# Patient Record
Sex: Male | Born: 1975 | Race: Black or African American | Hispanic: No | Marital: Single | Smoking: Never smoker
Health system: Southern US, Community
[De-identification: ages and names within clinical notes are randomized; demographics above are authoritative.]

## PROBLEM LIST (undated history)

## (undated) DIAGNOSIS — R011 Cardiac murmur, unspecified: Secondary | ICD-10-CM

## (undated) HISTORY — PX: ANTERIOR CRUCIATE LIGAMENT REPAIR: SHX115

## (undated) HISTORY — DX: Cardiac murmur, unspecified: R01.1

## (undated) HISTORY — PX: ROTATOR CUFF REPAIR: SHX139

---

## 1999-04-27 ENCOUNTER — Ambulatory Visit (HOSPITAL_COMMUNITY): Admission: RE | Admit: 1999-04-27 | Discharge: 1999-04-27 | Payer: Self-pay | Admitting: Family Medicine

## 2001-10-12 ENCOUNTER — Ambulatory Visit (HOSPITAL_BASED_OUTPATIENT_CLINIC_OR_DEPARTMENT_OTHER): Admission: RE | Admit: 2001-10-12 | Discharge: 2001-10-13 | Payer: Self-pay | Admitting: Orthopedic Surgery

## 2008-12-08 ENCOUNTER — Emergency Department (HOSPITAL_COMMUNITY): Admission: EM | Admit: 2008-12-08 | Discharge: 2008-12-09 | Payer: Self-pay | Admitting: Emergency Medicine
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
## 2014-03-19 ENCOUNTER — Encounter (HOSPITAL_COMMUNITY): Payer: Self-pay | Admitting: Emergency Medicine

## 2014-03-19 ENCOUNTER — Emergency Department (HOSPITAL_COMMUNITY)
Admission: EM | Admit: 2014-03-19 | Discharge: 2014-03-19 | Disposition: A | Discharge status: Home / Self Care | Payer: 59 | Attending: Emergency Medicine | Admitting: Emergency Medicine

## 2014-03-19 ENCOUNTER — Emergency Department (HOSPITAL_COMMUNITY): Payer: 59

## 2014-03-19 DIAGNOSIS — Y92410 Unspecified street and highway as the place of occurrence of the external cause: Secondary | ICD-10-CM | POA: Insufficient documentation

## 2014-03-19 DIAGNOSIS — Z792 Long term (current) use of antibiotics: Secondary | ICD-10-CM | POA: Insufficient documentation

## 2014-03-19 DIAGNOSIS — Y9389 Activity, other specified: Secondary | ICD-10-CM | POA: Diagnosis not present

## 2014-03-19 DIAGNOSIS — S0993XA Unspecified injury of face, initial encounter: Secondary | ICD-10-CM | POA: Diagnosis present

## 2014-03-19 DIAGNOSIS — S139XXA Sprain of joints and ligaments of unspecified parts of neck, initial encounter: Secondary | ICD-10-CM | POA: Insufficient documentation

## 2014-03-19 DIAGNOSIS — R404 Transient alteration of awareness: Secondary | ICD-10-CM | POA: Insufficient documentation
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
## 2014-03-19 NOTE — ED Notes (Signed)
Patient transported to CT 

## 2014-03-21 NOTE — ED Provider Notes (Signed)
Medical screening examination/treatment/procedure(s) were performed by non-physician practitioner and as supervising physician I was immediately available for consultation/collaboration.    Linwood DibblesJon Ab Leaming, MD 03/21/14 [phone]

## 2014-04-26 ENCOUNTER — Encounter (HOSPITAL_COMMUNITY): Payer: Self-pay | Admitting: Emergency Medicine

## 2014-04-26 ENCOUNTER — Emergency Department (HOSPITAL_COMMUNITY)
Admission: EM | Admit: 2014-04-26 | Discharge: 2014-04-26 | Disposition: A | Discharge status: Home / Self Care | Payer: 59 | Attending: Emergency Medicine | Admitting: Emergency Medicine

## 2014-04-26 ENCOUNTER — Emergency Department (HOSPITAL_COMMUNITY): Payer: 59

## 2014-04-26 DIAGNOSIS — R0789 Other chest pain: Secondary | ICD-10-CM

## 2014-04-26 DIAGNOSIS — R079 Chest pain, unspecified: Secondary | ICD-10-CM | POA: Diagnosis present

## 2014-04-26 LAB — CBC WITH DIFFERENTIAL/PLATELET
Basophils Absolute: 0 10*3/uL (ref 0.0–0.1)
Basophils Relative: 0 % (ref 0–1)
EOS ABS: 0.1 10*3/uL (ref 0.0–0.7)
EOS PCT: 3 % (ref 0–5)
HEMATOCRIT: 39.8 % (ref 39.0–52.0)
HEMOGLOBIN: 13.7 g/dL (ref 13.0–17.0)
LYMPHS ABS: 1.2 10*3/uL (ref 0.7–4.0)
Lymphocytes Relative: 22 % (ref 12–46)
MCH: 29 pg (ref 26.0–34.0)
MCHC: 34.4 g/dL (ref 30.0–36.0)
MCV: 84.3 fL (ref 78.0–100.0)
MONO ABS: 0.4 10*3/uL (ref 0.1–1.0)
MONOS PCT: 8 % (ref 3–12)
NEUTROS PCT: 67 % (ref 43–77)
Neutro Abs: 3.5 10*3/uL (ref 1.7–7.7)
Platelets: 198 10*3/uL (ref 150–400)
RBC: 4.72 MIL/uL (ref 4.22–5.81)
RDW: 12.2 % (ref 11.5–15.5)
WBC: 5.3 10*3/uL (ref 4.0–10.5)

## 2014-04-26 LAB — I-STAT TROPONIN, ED
TROPONIN I, POC: 0 ng/mL (ref 0.00–0.08)
Troponin i, poc: 0 ng/mL (ref 0.00–0.08)

## 2014-04-26 LAB — COMPREHENSIVE METABOLIC PANEL
ALK PHOS: 54 U/L (ref 39–117)
ALT: 27 U/L (ref 0–53)
ANION GAP: 12 (ref 5–15)
AST: 19 U/L (ref 0–37)
Albumin: 4 g/dL (ref 3.5–5.2)
BUN: 10 mg/dL (ref 6–23)
CO2: 28 mEq/L (ref 19–32)
Calcium: 8.9 mg/dL (ref 8.4–10.5)
Chloride: 104 mEq/L (ref 96–112)
Creatinine, Ser: 0.84 mg/dL (ref 0.50–1.35)
GFR calc non Af Amer: >90 mL/min (ref >90)
GLUCOSE: 114 mg/dL — AB (ref 70–99)
POTASSIUM: 3.9 meq/L (ref 3.7–5.3)
Sodium: 144 mEq/L (ref 137–147)
TOTAL PROTEIN: 7.6 g/dL (ref 6.0–8.3)
Total Bilirubin: 0.9 mg/dL (ref 0.3–1.2)

## 2014-04-26 MED ORDER — ASPIRIN 81 MG PO CHEW
324.0000 mg | CHEWABLE_TABLET | Freq: Once | ORAL | Status: AC
  Administered 2014-04-26: 324 mg via ORAL
  Filled 2014-04-26: qty 4

## 2014-04-26 NOTE — Discharge Instructions (Signed)
You were seen and evaluated for your chest pain symptoms. Your laboratory testing, EKG of your heart chest x-ray did not show any signs for a concerning for emergent cause of your symptoms. At this time your providers feel that your symptoms are most likely caused from muscle skeletal pains. Please followup with a primary care provider for continued evaluation and treatment. Return any time for changing or worsening symptoms.   Chest Pain (Nonspecific) It is often hard to give a diagnosis for the cause of chest pain. There is always a chance that your pain could be related to something serious, such as a heart attack or a blood clot in the lungs. You need to follow up with your doctor. HOME CARE  If antibiotic medicine was given, take it as directed by your doctor. Finish the medicine even if you start to feel better.  For the next few days, avoid activities that bring on chest pain. Continue physical activities as told by your doctor.  Do not use any tobacco products. This includes cigarettes, chewing tobacco, and e-cigarettes.  Avoid drinking alcohol.  Only take medicine as told by your doctor.  Follow your doctor's suggestions for more testing if your chest pain does not go away.  Keep all doctor visits you made. GET HELP IF:  Your chest pain does not go away, even after treatment.  You have a rash with blisters on your chest.  You have a fever. GET HELP RIGHT AWAY IF:   You have more pain or pain that spreads to your arm, neck, jaw, back, or belly (abdomen).  You have shortness of breath.  You cough more than usual or cough up blood.  You have very bad back or belly pain.  You feel sick to your stomach (nauseous) or throw up (vomit).  You have very bad weakness.  You pass out (faint).  You have chills. This is an emergency. Do not wait to see if the problems will go away. Call your local emergency services (911 in [place] Do not drive yourself to the hospital. MAKE  SURE YOU:   Understand these instructions.  Will watch your condition.  Will get help right away if you are not doing well or get worse. Document Released: 02/16/2008 Document Revised: 09/04/2013 Document Reviewed: 02/16/2008 [facility] LPExitCare Patient Information 2015 [place], [place]. This information is not intended to replace advice given to you by your health care provider. Make sure you discuss any questions you have with your health care provider.

## 2014-04-26 NOTE — ED Provider Notes (Signed)
CSN: [id number]     Arrival date & time 04/26/14  0039 History   First MD Initiated Contact with Patient 04/26/14 0254     Chief Complaint  Patient presents with  . Chest Pain    HPI  History provided by the patient. Patient is a 38 year old male with no significant PMH presenting with sharp left chest pain. Patient states that he was watching TV late last night and early this morning and as he was getting up from his chair to go to bed he suddenly had a sharp pain into his left chest and near his armpit. The pain caused him to freeze in this position and made it very difficult to even breathe deeply. He had very severe pain for about 20 minutes. This did continued to last and slowly go away over the next one to 2 hours. Patient currently is pain-free. He denies having any shortness of breath. He has been feeling well recently. Denies any significant strenuous activity recently. Denies any other aggravating or alleviating factors. No other associated symptoms. He is a nonsmoker. No history of hypertension, diabetes or hypercholesterolemia. No significant family history for coronary artery disease. Patient has no prior history of DVTs or PE. Denies any hemoptysis. No recent travel. No history of cancer.     History reviewed. No pertinent past medical history. Past Surgical History  Procedure Laterality Date  . Anterior cruciate ligament repair    . Rotator cuff repair     No family history on file. History  Substance Use Topics  . Smoking status: Never Smoker   . Smokeless tobacco: Not on file  . Alcohol Use: No    Review of Systems  Constitutional: Negative for fever.  Respiratory: Negative for cough.   Cardiovascular: Positive for chest pain.  All other systems reviewed and are negative.     Allergies  Review of patient's allergies indicates no known allergies.  Home Medications   Prior to Admission medications   Not on File   BP 125/58  Pulse 60  Temp(Src) 98.1 F (36.7  C) (Oral)  Resp 16  Ht 5\' 11"  (1.803 m)  Wt 168 lb (76.204 kg)  BMI 23.44 kg/m2  SpO2 98% Physical Exam  Nursing note and vitals reviewed. Constitutional: He is oriented to person, place, and time. He appears well-developed and well-nourished. No distress.  HENT:  Head: Normocephalic.  Cardiovascular: Normal rate and regular rhythm.   No murmur heard. Pulmonary/Chest: Effort normal and breath sounds normal. No respiratory distress. He has no wheezes. He has no rales. He exhibits no tenderness.  Abdominal: Soft. There is no tenderness. There is no rebound and no guarding.  Neurological: He is alert and oriented to person, place, and time.  Skin: Skin is warm.    ED Course  Procedures   COORDINATION OF CARE:  Nursing notes reviewed. Vital signs reviewed. Initial pt interview and examination performed.   Filed Vitals:   04/26/14 0049  BP: 125/58  Pulse: 60  Temp: 98.1 F (36.7 C)  TempSrc: Oral  Resp: 16  Height: 5\' 11"  (1.803 m)  Weight: 168 lb (76.204 kg)  SpO2: 98%    3:18 AM- patient seen and evaluated. He is well-appearing no acute distress. Denies any pain symptoms this time. No significant risk factors for ACS. Atypical pains at rest. Patient is PERC negative.   Treatment plan initiated: Medications  aspirin chewable tablet 324 mg (not administered)    Results for orders placed during the hospital encounter of  04/26/14  CBC WITH DIFFERENTIAL      Result Value Ref Range   WBC 5.3  4.0 - 10.5 K/uL   RBC 4.72  4.22 - 5.81 MIL/uL   Hemoglobin 13.7  13.0 - 17.0 g/dL   HCT 16.1  09.6 - 04.5 %   MCV 84.3  78.0 - 100.0 fL   MCH 29.0  26.0 - 34.0 pg   MCHC 34.4  30.0 - 36.0 g/dL   RDW 40.9  81.1 - 91.4 %   Platelets 198  150 - 400 K/uL   Neutrophils Relative % 67  43 - 77 %   Neutro Abs 3.5  1.7 - 7.7 K/uL   Lymphocytes Relative 22  12 - 46 %   Lymphs Abs 1.2  0.7 - 4.0 K/uL   Monocytes Relative 8  3 - 12 %   Monocytes Absolute 0.4  0.1 - 1.0 K/uL    Eosinophils Relative 3  0 - 5 %   Eosinophils Absolute 0.1  0.0 - 0.7 K/uL   Basophils Relative 0  0 - 1 %   Basophils Absolute 0.0  0.0 - 0.1 K/uL  COMPREHENSIVE METABOLIC PANEL      Result Value Ref Range   Sodium 144  137 - 147 mEq/L   Potassium 3.9  3.7 - 5.3 mEq/L   Chloride 104  96 - 112 mEq/L   CO2 28  19 - 32 mEq/L   Glucose, Bld 114 (*) 70 - 99 mg/dL   BUN 10  6 - 23 mg/dL   Creatinine, Ser 7.82  0.50 - 1.35 mg/dL   Calcium 8.9  8.4 - 95.6 mg/dL   Total Protein 7.6  6.0 - 8.3 g/dL   Albumin 4.0  3.5 - 5.2 g/dL   AST 19  0 - 37 U/L   ALT 27  0 - 53 U/L   Alkaline Phosphatase 54  39 - 117 U/L   Total Bilirubin 0.9  0.3 - 1.2 mg/dL   GFR calc non Af Amer >90  >90 mL/min   GFR calc Af Amer >90  >90 mL/min   Anion gap 12  5 - 15  I-STAT TROPOININ, ED      Result Value Ref Range   Troponin i, poc 0.00  0.00 - 0.08 ng/mL   Comment 3           I-STAT TROPOININ, ED      Result Value Ref Range   Troponin i, poc 0.00  0.00 - 0.08 ng/mL   Comment 3                Imaging Review Dg Chest 2 View  04/26/2014   CLINICAL DATA:  Left-sided chest pain.  EXAM: CHEST  2 VIEW  COMPARISON:  None.  FINDINGS: The lungs are well-aerated and clear. There is no evidence of focal opacification, pleural effusion or pneumothorax.  The heart is normal in size; the mediastinal contour is within normal limits. No acute osseous abnormalities are seen.  IMPRESSION: No acute cardiopulmonary process seen.   Electronically Signed   By: Roanna Raider M.D.   On: 04/26/2014 02:10     EKG Interpretation None      Date: 04/26/2014  Rate: 67  Rhythm: normal sinus rhythm and sinus arrhythmia  QRS Axis: normal  Intervals: normal  ST/T Wave abnormalities: nonspecific ST/T changes  Conduction Disutrbances:none  Narrative Interpretation:   Old EKG Reviewed: none available   MDM   Final diagnoses:  Musculoskeletal  chest pain        Angus Seller, PA-C 04/26/14 0403

## 2014-04-26 NOTE — ED Notes (Signed)
Pt to ED via [facility] with c/o chest pain.  Pt st's he was getting up from couch when he felt something pull under left breast.  C/O pain with movement and deep breath.

## 2014-04-27 NOTE — ED Provider Notes (Signed)
Medical screening examination/treatment/procedure(s) were performed by non-physician practitioner and as supervising physician I was immediately available for consultation/collaboration.   EKG Interpretation None       Shanette Tamargo, MD 04/27/14 0033 
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
## 2015-01-11 IMAGING — CR DG CHEST 2V
2 series · 2 of 2 positions shown · non-contrast
Comparison: None.

CLINICAL DATA: Left-sided chest pain.

EXAM:
CHEST  2 VIEW

[w chest pa]
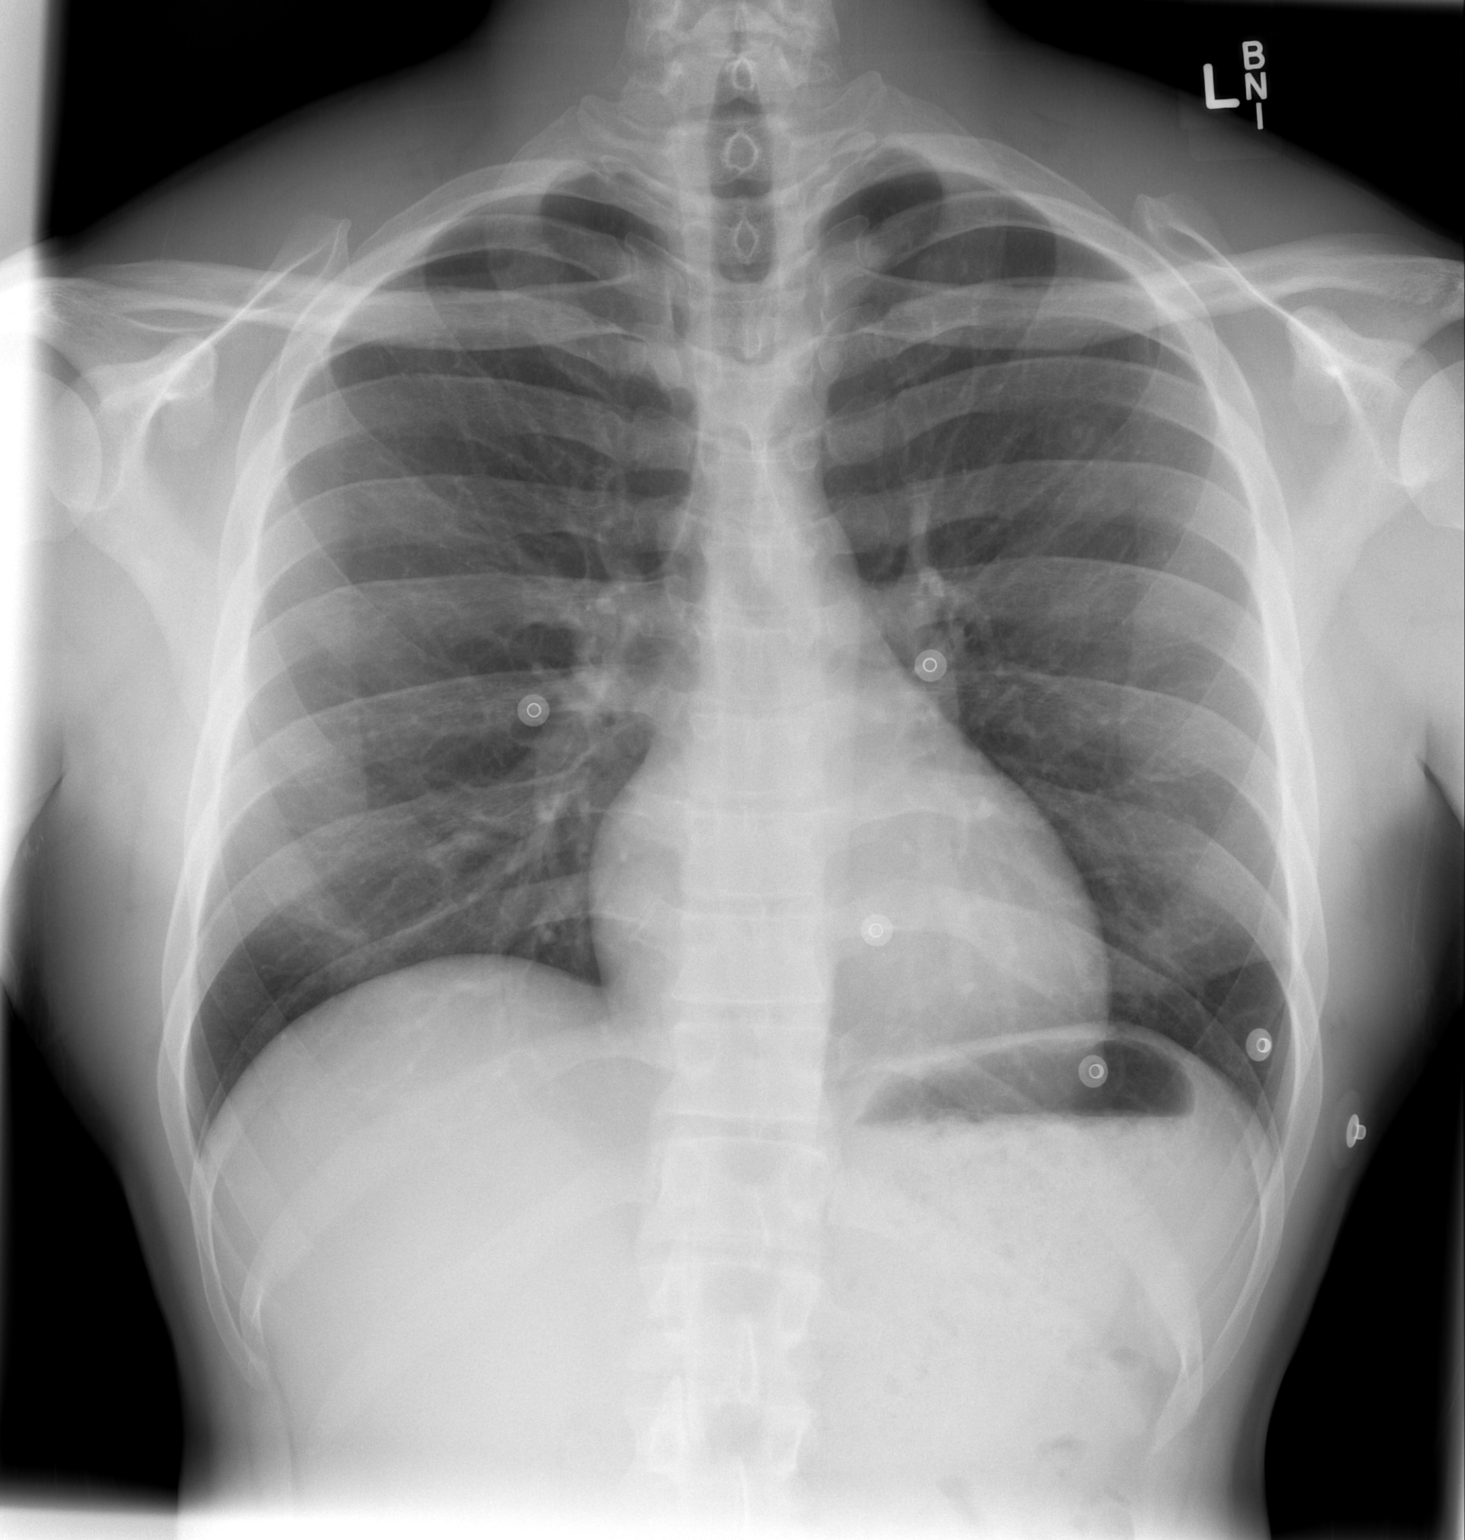

[w chest lat]
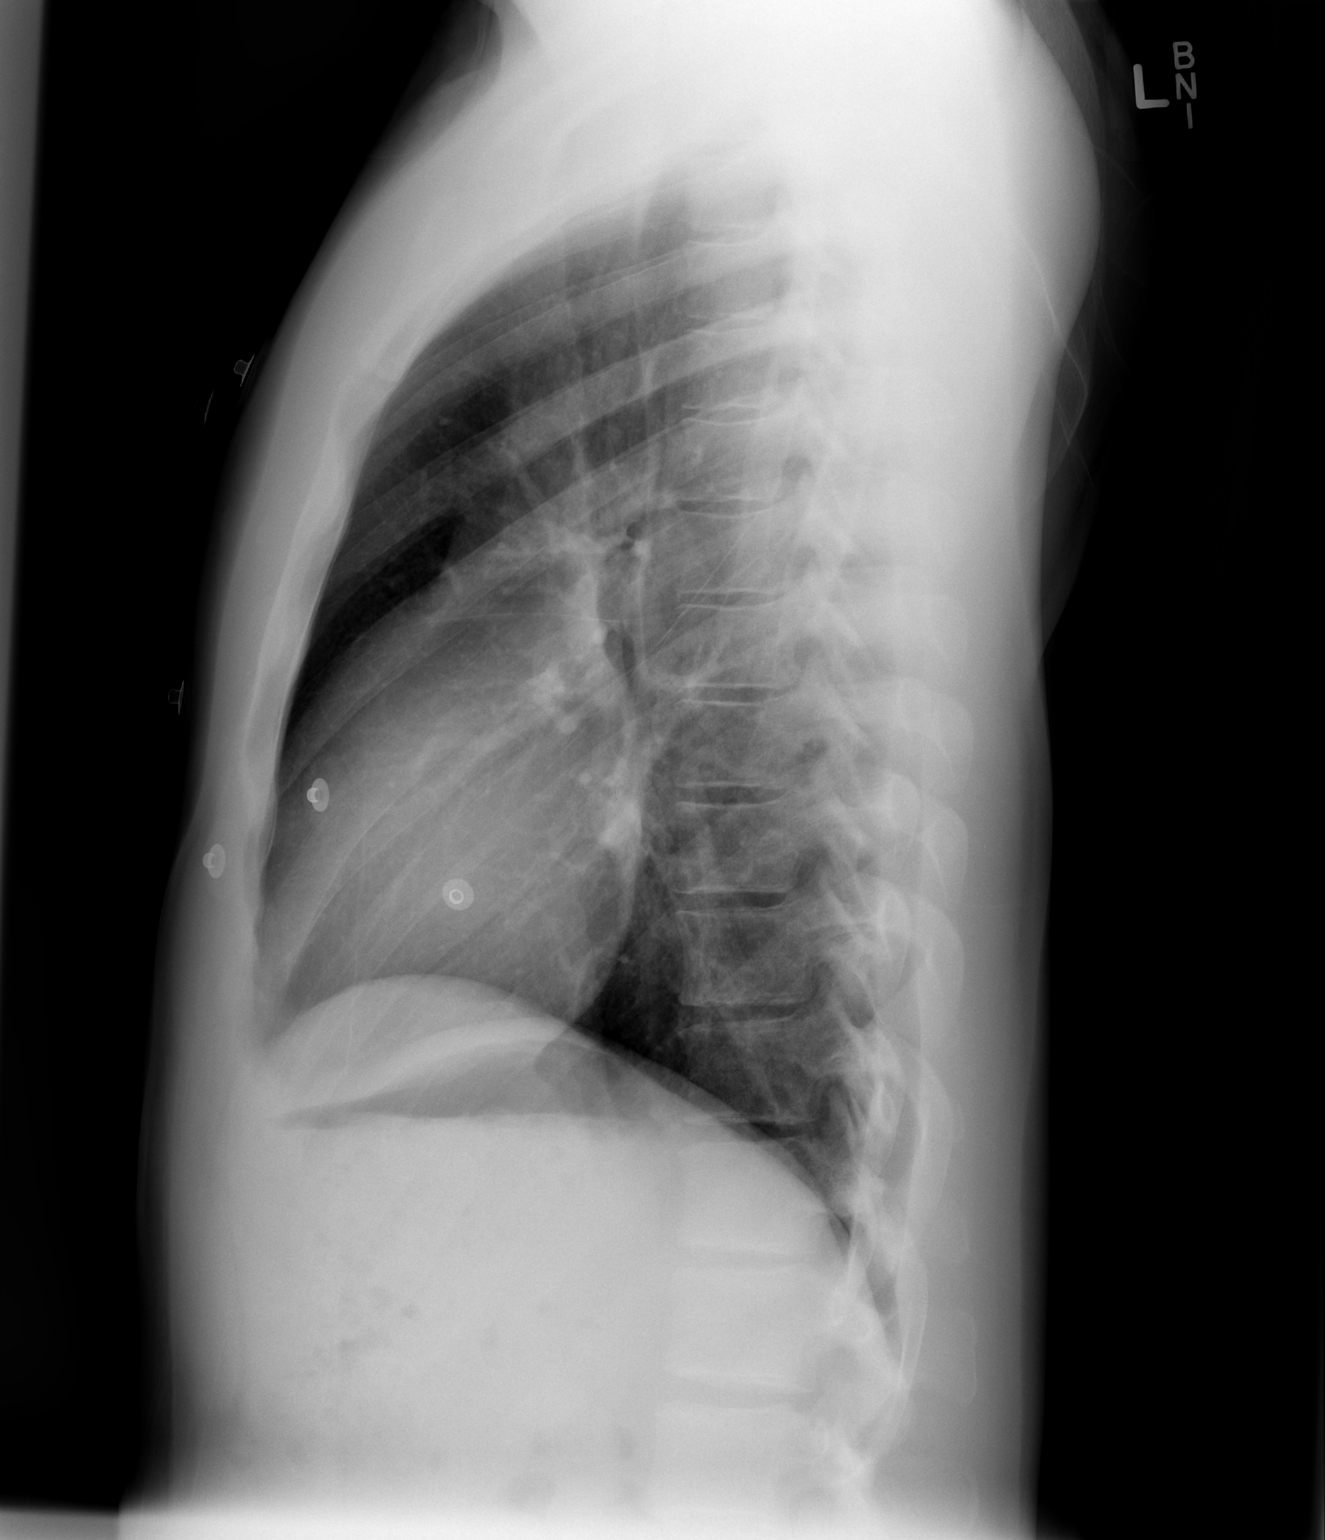

[2 of 2 positions shown; findings below may reference images not displayed]

FINDINGS: The lungs are well-aerated and clear. There is no evidence of focal
opacification, pleural effusion or pneumothorax.

The heart is normal in size; the mediastinal contour is within
normal limits. No acute osseous abnormalities are seen.
IMPRESSION: No acute cardiopulmonary process seen.

## 2015-12-30 ENCOUNTER — Ambulatory Visit (INDEPENDENT_AMBULATORY_CARE_PROVIDER_SITE_OTHER): Payer: 59

## 2015-12-30 ENCOUNTER — Ambulatory Visit (INDEPENDENT_AMBULATORY_CARE_PROVIDER_SITE_OTHER): Payer: 59 | Admitting: Family Medicine

## 2015-12-30 VITALS — BP 108/62 | HR 56 | Temp 98.2°F | Resp 16 | Ht 70.0 in | Wt 168.0 lb

## 2015-12-30 DIAGNOSIS — R1032 Left lower quadrant pain: Secondary | ICD-10-CM

## 2015-12-30 LAB — POCT CBC
Granulocyte percent: 53.8 %G (ref 37–80)
HCT, POC: 39.2 % — AB (ref 43.5–53.7)
Hemoglobin: 13.8 g/dL — AB (ref 14.1–18.1)
Lymph, poc: 1.5 (ref 0.6–3.4)
MCH, POC: 29.2 pg (ref 27–31.2)
MCHC: 35.2 g/dL (ref 31.8–35.4)
MCV: 83 fL (ref 80–97)
MID (cbc): 0.4 (ref 0–0.9)
MPV: 7.9 fL (ref 0–99.8)
POC Granulocyte: 2.2 (ref 2–6.9)
POC LYMPH PERCENT: 37.3 %L (ref 10–50)
POC MID %: 8.9 %M (ref 0–12)
Platelet Count, POC: 183 10*3/uL (ref 142–424)
RBC: 4.73 M/uL (ref 4.69–6.13)
RDW, POC: 12.7 %
WBC: 4.1 10*3/uL — AB (ref 4.6–10.2)

## 2015-12-30 MED ORDER — POLYETHYLENE GLYCOL 3350 17 GM/SCOOP PO POWD: 17.0000 g | Freq: Two times a day (BID) | ORAL | Status: AC | PRN

## 2015-12-30 NOTE — Progress Notes (Signed)
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 Comes in with left-sided abdominal pain which is intermittent the last 24 hours. It's never been really severe has not prevented him from sleeping.  He denies any constipation or urinary problems. He also denies any fever. He's never had this before except when he was about fifth-grade when he was told he had gas pulse in his left side and that it would pass up he stopped eating greasy foods.  Objective: Thin, athletic individual in no acute distress BP 108/62 mmHg  Pulse 56  Temp(Src) 98.2 F (36.8 C)  Resp 16  Ht 5\' 10"  (1.778 m)  Wt 168 lb (76.204 kg)  BMI 24.11 kg/m2  SpO2 98% Chest is clear Heart: Regular no murmur Abdomen: No significant tenderness although patient does feel some fullness in the left lower quadrant with deep palpation. Skin: No rash Extremities: No edema, full range of motion Results for orders placed or performed in visit on 12/30/15  POCT CBC  Result Value Ref Range   WBC 4.1 (A) 4.6 - 10.2 K/uL   Lymph, poc 1.5 0.6 - 3.4   POC LYMPH PERCENT 37.3 10 - 50 %L   MID (cbc) 0.4 0 - 0.9   POC MID % 8.9 0 - 12 %M   POC Granulocyte 2.2 2 - 6.9   Granulocyte percent 53.8 37 - 80 %G   RBC 4.73 4.69 - 6.13 M/uL   Hemoglobin 13.8 (A) 14.1 - 18.1 g/dL   HCT, POC 16.139.2 (A) 09.643.5 - 53.7 %   MCV 83.0 80 - 97 fL   MCH, POC 29.2 27 - 31.2 pg   MCHC 35.2 31.8 - 35.4 g/dL   RDW, POC 04.512.7 %   Platelet Count, POC 183 142 - 424 K/uL   MPV 7.9 0 - 99.8 fL   KUB of abdomen reveals normal stool pattern  Assessment:  I believe this patient is having some degree of obstipation.  Plan: MiraLAX for the next 24-48 hours, call if symptoms worsen.  Signed, Sheila OatsKurt Horace Lukas M.D.

## 2015-12-30 NOTE — Patient Instructions (Addendum)
It appears that there are some undigested food and left side near: And therefore I'm giving you a laxative to get this to move out. Usually this takes about 24-48 hours. If you're pain worsens or does not resolve, please call    IF you received an x-ray today, you will receive an invoice from [facility]. Please contact [facility] at [phone] with questions or concerns regarding your invoice.   IF you received labwork today, you will receive an invoice from [facility]. Please contact [facility] at [phone] with questions or concerns regarding your invoice.   Our billing staff will not be able to assist you with questions regarding bills from these companies.  You will be contacted with the lab results as soon as they are available. The fastest way to get your results is to activate your My Chart account. Instructions are located on the last page of this paperwork. If you have not heard from us regarding the results in 2 weeks, please contact this office.

## 2016-09-15 IMAGING — CR DG ABDOMEN 1V
1 series · 1 of 1 positions shown · non-contrast
Comparison: None in PACs

CLINICAL DATA: Left lower quadrant discomfort

EXAM:
ABDOMEN - 1 VIEW

[AP]
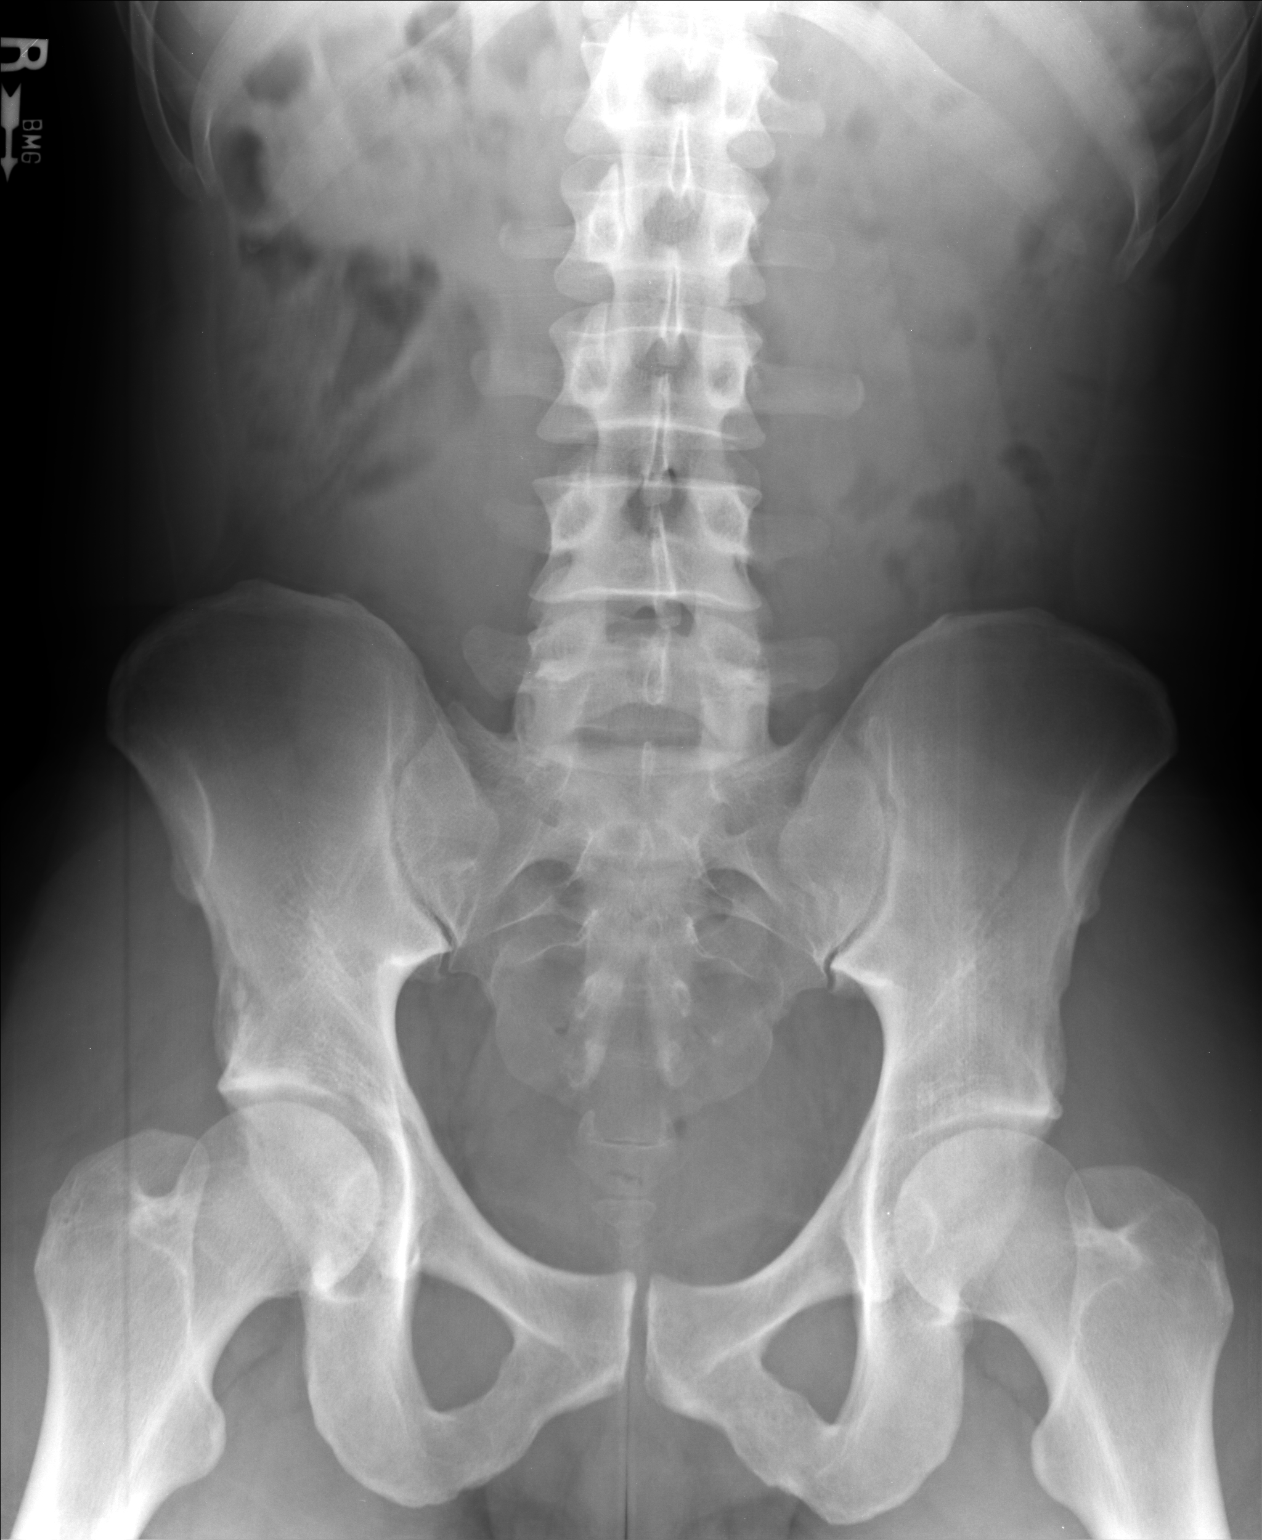

[1 of 1 positions shown; findings below may reference images not displayed]

FINDINGS: The bowel gas pattern is normal. No appendicolith or other fecaliths
are observed. No soft tissue calcifications are observed overlying
the renal silhouettes or along the course of the ureters. The bony
structures are unremarkable.
IMPRESSION: There is no acute intra-abdominal abnormality observed on this
supine abdominal film. If the patient's symptoms suggest
diverticulitis or other forms of colitis, abdominal and pelvic CT
scanning with oral and IV contrast would be useful.

## 2018-03-06 ENCOUNTER — Encounter (HOSPITAL_COMMUNITY): Payer: Self-pay | Admitting: Obstetrics and Gynecology

## 2018-03-06 ENCOUNTER — Other Ambulatory Visit: Payer: Self-pay

## 2018-03-06 DIAGNOSIS — Y999 Unspecified external cause status: Secondary | ICD-10-CM | POA: Insufficient documentation

## 2018-03-06 DIAGNOSIS — Y929 Unspecified place or not applicable: Secondary | ICD-10-CM | POA: Insufficient documentation

## 2018-03-06 DIAGNOSIS — Y939 Activity, unspecified: Secondary | ICD-10-CM | POA: Diagnosis not present

## 2018-03-06 DIAGNOSIS — T162XXA Foreign body in left ear, initial encounter: Secondary | ICD-10-CM | POA: Insufficient documentation

## 2018-03-06 DIAGNOSIS — W228XXA Striking against or struck by other objects, initial encounter: Secondary | ICD-10-CM | POA: Insufficient documentation

## 2018-03-06 NOTE — ED Triage Notes (Signed)
Per PT: Pt reports he has a bug in his ear that flew in.  RN looked inside pt's ear and saw nothing.  Pt reports he "felt it digging around deep in his ear"

## 2018-03-07 ENCOUNTER — Emergency Department (HOSPITAL_COMMUNITY)
Admission: EM | Admit: 2018-03-07 | Discharge: 2018-03-07 | Disposition: A | Discharge status: Home / Self Care | Payer: 59 | Attending: Emergency Medicine | Admitting: Emergency Medicine

## 2018-03-07 DIAGNOSIS — T162XXA Foreign body in left ear, initial encounter: Secondary | ICD-10-CM

## 2018-03-07 NOTE — ED Notes (Signed)
Bed: WA03 Expected date:  Expected time:  Means of arrival:  Comments: 

## 2018-03-07 NOTE — ED Provider Notes (Signed)
[facility]-EMERGENCY DEPT Provider Note   CSN: [id number] Arrival date & time: 03/06/18  2315     History   Chief Complaint Chief Complaint  Patient presents with  . Foreign Body in Ear    HPI Benjamin [name] is a 42 y.o. male.  HPI Benjamin [name] is a 42 y.o. male presents emergency department with complaint of foreign body in the ear.  Patient states that he felt a bug fly into his ear this evening.  He states he felt bug moving around.  EMS was called.  Patient came here by ambulance.  He states the bug is still moving in his ear.  He reports pain that comes and goes.  He denies changes in hearing.  He states he has been having his finger in the ear entire time and states he is certain that the bug did not crawl out yet.  No treatment prior to coming in.  Past Medical History:  Diagnosis Date  . Heart murmur     There are no active problems to display for this patient.   Past Surgical History:  Procedure Laterality Date  . ANTERIOR CRUCIATE LIGAMENT REPAIR    . ROTATOR CUFF REPAIR          Home Medications    Prior to Admission medications   Medication Sig Start Date End Date Taking? Authorizing Provider  polyethylene glycol powder (GLYCOLAX/MIRALAX) powder Take 17 g by mouth 2 (two) times daily as needed. 12/30/15   Elvina SidleLauenstein, Kurt, MD    Family History Family History  Problem Relation Age of Onset  . Cancer Mother     Social History Social History   Tobacco Use  . Smoking status: Never Smoker  Substance Use Topics  . Alcohol use: No  . Drug use: No     Allergies   Patient has no known allergies.   Review of Systems Review of Systems  Constitutional: Negative for chills and fever.  HENT: Positive for ear pain. Negative for hearing loss.   Musculoskeletal: Negative for arthralgias.  Neurological: Negative for headaches.  All other systems reviewed and are negative.    Physical Exam Updated Vital Signs BP 111/66  (BP Location: Left Arm)   Pulse 68   Temp 99.2 F (37.3 C) (Oral)   Resp 17   Ht 5\' 10"  (1.778 m)   Wt 78 kg (172 lb)   SpO2 100%   BMI 24.68 kg/m   Physical Exam  Constitutional: He appears well-developed and well-nourished. No distress.  HENT:  There is a small superficial abrasion to the tragus of the left ear.  Otherwise external ears normal.  Normal ear canal.  Normal TM, intact.  No foreign body, swelling or exudate present.  Eyes: Conjunctivae are normal.  Neck: Neck supple.  Cardiovascular: Normal rate.  Pulmonary/Chest: No respiratory distress.  Abdominal: He exhibits no distension.  Skin: Skin is warm and dry.  Nursing note and vitals reviewed.    ED Treatments / Results  Labs (all labs ordered are listed, but only abnormal results are displayed) Labs Reviewed - No data to display  EKG None  Radiology No results found.  Procedures Procedures (including critical care time)  Medications Ordered in ED Medications - No data to display   Initial Impression / Assessment and Plan / ED Course  I have reviewed the triage vital signs and the nursing notes.  Pertinent labs & imaging results that were available during my care of the patient were reviewed by  me and considered in my medical decision making (see chart for details).     Patient with sensation or foreign object in the left ear, my exam no foreign body seen.  TM is intact.  Patient requesting ENT referral.  I will give him the number for ENT clinic.  We will have him follow-up there.  Vitals:   03/06/18 2331 03/07/18 0122  BP: 136/77 111/66  Pulse: 77 68  Resp: 14 17  Temp: 99.2 F (37.3 C)   TempSrc: Oral   SpO2: 100% 100%  Weight: 78 kg (172 lb)   Height: 5\' 10"  (1.778 m)      Final Clinical Impressions(s) / ED Diagnoses   Final diagnoses:  Foreign body of left ear, initial encounter    ED Discharge Orders    None       Jaynie Crumble, PA-C 03/07/18 0141    Molpus, Jonny Ruiz,  MD 03/07/18 0710

## 2018-03-07 NOTE — Discharge Instructions (Signed)
Your exam today shows no insect or any other foreign body in the left ear.  Your eardrum appears to be intact. Please follow up as needed.

## 2021-07-10 ENCOUNTER — Other Ambulatory Visit: Payer: Self-pay

## 2021-07-10 ENCOUNTER — Ambulatory Visit (HOSPITAL_COMMUNITY)
Admission: EM | Admit: 2021-07-10 | Discharge: 2021-07-10 | Disposition: A | Discharge status: Home / Self Care | Payer: 59 | Attending: Emergency Medicine | Admitting: Emergency Medicine

## 2021-07-10 ENCOUNTER — Encounter (HOSPITAL_COMMUNITY): Payer: Self-pay

## 2021-07-10 DIAGNOSIS — R1030 Lower abdominal pain, unspecified: Secondary | ICD-10-CM | POA: Insufficient documentation

## 2021-07-10 LAB — POCT URINALYSIS DIPSTICK, ED / UC
Bilirubin Urine: NEGATIVE
Glucose, UA: NEGATIVE mg/dL
Hgb urine dipstick: NEGATIVE
Ketones, ur: NEGATIVE mg/dL
Nitrite: NEGATIVE
Protein, ur: NEGATIVE mg/dL
Specific Gravity, Urine: 1.015 (ref 1.005–1.030)
Urobilinogen, UA: 0.2 mg/dL (ref 0.0–1.0)
pH: 7 (ref 5.0–8.0)

## 2021-07-10 MED ORDER — TIZANIDINE HCL 4 MG PO TABS: 4.0000 mg | ORAL_TABLET | Freq: Three times a day (TID) | ORAL | 0 refills | Status: AC | PRN

## 2021-07-10 NOTE — ED Provider Notes (Signed)
HPI  SUBJECTIVE:  Benjamin [name] is a 45 y.o. male who presents with 1 month of left lower quadrant pain described as "tightness", crampy that lasts seconds to minutes over the past month when he started going through a divorce.  No nausea, vomiting, fevers, abdominal distention, urinary complaints, trauma to the area, swelling or bulging in the area of pain, back pain, penile rash, discharge testicular pain or swelling.  The pain is not associated with eating, urination, defecation, exertion, torso rotation or movement.  No unintentional weight loss, night sweats, change in stool caliber. He has tried discontinuing his vitamins without improvement in his symptoms.  Symptoms are better with stretching the area/leaning to the right.  Symptoms are worse when he he is stressed.  He has no other sexual partners in the soon-to-be ex-wife who he thinks is asymptomatic.  He is not sure if she has any other partners.  He has had symptoms like this before when he was younger which was attributed to gas.  No history of nephrolithiasis, UTI, diverticulitis, aortic abdominal aneurysm, atrial fibrillation or mesenteric ischemia, hypercoagulability.  PMD: None.   Past Medical History:  Diagnosis Date   Heart murmur     Past Surgical History:  Procedure Laterality Date   ANTERIOR CRUCIATE LIGAMENT REPAIR     ROTATOR CUFF REPAIR      Family History  Problem Relation Age of Onset   Cancer Mother     Social History   Tobacco Use   Smoking status: Never   Smokeless tobacco: Never  Substance Use Topics   Alcohol use: No   Drug use: Never    No current facility-administered medications for this encounter.  Current Outpatient Medications:    Multiple Vitamin (MULTIVITAMIN) tablet, Take 1 tablet by mouth daily., Disp: , Rfl:    tiZANidine (ZANAFLEX) 4 MG tablet, Take 1 tablet (4 mg total) by mouth every 8 (eight) hours as needed for muscle spasms., Disp: 30 tablet, Rfl: 0   polyethylene glycol powder  (GLYCOLAX/MIRALAX) powder, Take 17 g by mouth 2 (two) times daily as needed., Disp: 3350 g, Rfl: 1  No Known Allergies   ROS  As noted in HPI.   Physical Exam  BP 129/61 (BP Location: Right Arm)   Pulse (!) 57   Temp 98 F (36.7 C) (Oral)   Resp 16   SpO2 100%   Constitutional: Well developed, well nourished, no acute distress Eyes:  EOMI, conjunctiva normal bilaterally HENT: Normocephalic, atraumatic,mucus membranes moist Respiratory: Normal inspiratory effort Cardiovascular: Normal rate GI: Normal appearance, soft, nontender, no visual or palpable soft tissue defect.  No appreciable hernia with Valsalva.  patient points lateral to the left rectus abdominis as location of the pain, however, there is no tenderness in this region.  Active bowel sounds.  No rebound, guarding. Back: No CVAT skin: No rash, skin intact Musculoskeletal: no deformities Neurologic: Alert & oriented x 3, no focal neuro deficits Psychiatric: Speech and behavior appropriate   ED Course   Medications - No data to display  Orders Placed This Encounter  Procedures   Urine Culture    Standing Status:   Standing    Number of Occurrences:   1    Order Specific Question:   Indication    Answer:   Flank Pain   POCT Urinalysis Dipstick (ED/UC)    Standing Status:   Standing    Number of Occurrences:   1    Results for orders placed or performed during the hospital  encounter of 07/10/21 (from the past 24 hour(s))  POCT Urinalysis Dipstick (ED/UC)     Status: Abnormal   Collection Time: 07/10/21  4:00 PM  Result Value Ref Range   Glucose, UA NEGATIVE NEGATIVE mg/dL   Bilirubin Urine NEGATIVE NEGATIVE   Ketones, ur NEGATIVE NEGATIVE mg/dL   Specific Gravity, Urine 1.015 1.005 - 1.030   Hgb urine dipstick NEGATIVE NEGATIVE   pH 7.0 5.0 - 8.0   Protein, ur NEGATIVE NEGATIVE mg/dL   Urobilinogen, UA 0.2 0.0 - 1.0 mg/dL   Nitrite NEGATIVE NEGATIVE   Leukocytes,Ua TRACE (A) NEGATIVE   No results  found.  ED Clinical Impression  1. Lower abdominal pain      ED Assessment/Plan  UA with trace leukocytes.  However, patient has no urinary or GU complaints.  Doubt prostatitis or STD due to location of the pain.  Discussed lab results with patient. I am not sure that STD is causing the symptoms or bring him in today, but given the leukocytes in his urine, he could certainly have an STD.  Offered to test today, but he just urinated immediately prior to evaluation.  He would have to come back for sample or could follow-up with the health department or regional Center for infectious diseases.  Patient will follow-up with the health department/[facility].  his abdomen is nontender, there is no palpable soft tissue defect or hernia visualized.  Doubt diverticulitis for a month.  He has no left lower quadrant tenderness.  sending urine off for culture prior to initiating antibiotic treatment.  No evidence of a incarcerated or strangulated hernia.  Sending home with some Zanaflex.  Providing primary care list and ordering assistance in finding a PMD.  ER return  Precautions given  Discussed labs, MDM, treatment plan, and plan for follow-up with patient. Discussed sn/sx that should prompt return to the ED. patient agrees with plan.   Meds ordered this encounter  Medications   tiZANidine (ZANAFLEX) 4 MG tablet    Sig: Take 1 tablet (4 mg total) by mouth every 8 (eight) hours as needed for muscle spasms.    Dispense:  30 tablet    Refill:  0      *This clinic note was created using Scientist, clinical (histocompatibility and immunogenetics). Therefore, there may be occasional mistakes despite careful proofreading.  ?    Domenick Gong, MD 07/11/21 [phone]

## 2021-07-10 NOTE — ED Triage Notes (Addendum)
Pt reports left lower quadrant abdominal cramps on and off x 2 week. States pain started when started the divorce process. Denies having pain at this moment. States the cramps are worse when stand and lean to the right side.

## 2021-07-10 NOTE — Discharge Instructions (Addendum)
I suspect that this is musculoskeletal given the location and duration of your symptoms.  However, you have trace leukocytes in your urine, which is suggestive of inflammation.  I am going to send your urine off for culture prior to initiating treatment for an infection.  Can try Zanaflex for muscle spasms, 600 mg of ibuprofen combined with 1000 mg of Tylenol together 3-4 times a day as needed for pain  Below is a list of primary care practices who are taking new patients for you to follow-up with.  [facility] internal medicine clinic Ground Floor - [facility], [address] [place], [place], [place] [address] [phone]  [facility] at [facility] [address] [place], [place] [address] [phone]  [facility] [address] [place], [place] [address] [phone]  [facility] [phone] [address] [place] [address]  [facility]: [address] [place] [address]  [phone]  [facility]: [address] [place] [address]  [phone]  [facility] : [address] [place] [address] [phone]  [facility]: [address] [place] [address] [phone]  [facility]: [address] [place] [address] [phone]  Dr. Oneal Grout [address] [facility] [place] [address]  [phone]  Go to www.goodrx.com  or www.costplusdrugs.com to look up your medications. This will give you a list of where you can find your prescriptions at the most affordable prices. Or ask the pharmacist what the cash price is, or if they have any other discount programs available to help make your medication more affordable. This can be less expensive than what you  would pay with insurance.

## 2021-07-12 LAB — URINE CULTURE: Culture: NO GROWTH

## 2021-07-27 ENCOUNTER — Telehealth: Payer: Self-pay

## 2021-07-27 NOTE — Telephone Encounter (Signed)
The office will be closed at 12:30 on 07/30/21. Left vm for Patient to call [phone] to reschedule appt with Dr. Johnson 

## 2021-07-30 ENCOUNTER — Inpatient Hospital Stay: Payer: 59 | Admitting: Internal Medicine

## 2021-07-30 ENCOUNTER — Ambulatory Visit: Payer: Self-pay | Admitting: *Deleted

## 2021-07-30 NOTE — Telephone Encounter (Signed)
Pt calling to reschedule Hospital F/U appt. Appt for today was cancelled by provider. Attempted to secure earliest appt available. Pt questioning if he could be fit in early appt as provider cancelled. States needs blood work done. Assured pt NT would route to practice for review.  Please advise: [phone] Reason for Disposition  Requesting regular office appointment  Answer Assessment - Initial Assessment Questions 1. REASON FOR CALL or QUESTION: "What is your reason for calling today?" or "How can I best help you?" or "What question do you have that I can help answer?"     Reschedule appt.  Protocols used: Information Only Call - No Triage-A-AH

## 2021-07-31 ENCOUNTER — Telehealth: Payer: Self-pay | Admitting: *Deleted

## 2021-07-31 NOTE — Telephone Encounter (Signed)
Copied from CRM [id number]. Topic: General - Other >> Jul 30, 2021  2:20 PM Gwenlyn Fudge wrote: Reason for CRM: Pt called stating that he is needing to have an appt soon for his hospital follow up. Please advise.

## 2021-07-31 NOTE — Telephone Encounter (Signed)
Called patient to schedule appt for HFU.  Left voicemail asking to return call.   May schedule appt if still available at [facility]-  Have patient call or transfer call to [phone] or [phone].

## 2021-07-31 NOTE — Telephone Encounter (Signed)
Called patient no answer. Left vm to call [phone] to reschedule appt.

## 2021-08-13 ENCOUNTER — Ambulatory Visit: Payer: 59 | Admitting: Physician Assistant
